# Patient Record
Sex: Male | Born: 1960 | Race: Black or African American | Hispanic: No | Marital: Married | Smoking: Current every day smoker
Health system: Southern US, Community
[De-identification: ages and names within clinical notes are randomized; demographics above are authoritative.]

## PROBLEM LIST (undated history)

## (undated) DIAGNOSIS — I10 Essential (primary) hypertension: Secondary | ICD-10-CM

---

## 2020-08-11 ENCOUNTER — Emergency Department: Payer: Federal, State, Local not specified - PPO

## 2020-08-11 ENCOUNTER — Emergency Department
Admission: EM | Admit: 2020-08-11 | Discharge: 2020-08-29 | Disposition: E | Payer: Federal, State, Local not specified - PPO | Attending: Emergency Medicine | Admitting: Emergency Medicine

## 2020-08-11 DIAGNOSIS — I10 Essential (primary) hypertension: Secondary | ICD-10-CM | POA: Insufficient documentation

## 2020-08-11 DIAGNOSIS — D696 Thrombocytopenia, unspecified: Secondary | ICD-10-CM | POA: Insufficient documentation

## 2020-08-11 DIAGNOSIS — Z20822 Contact with and (suspected) exposure to covid-19: Secondary | ICD-10-CM | POA: Insufficient documentation

## 2020-08-11 DIAGNOSIS — I639 Cerebral infarction, unspecified: Secondary | ICD-10-CM | POA: Diagnosis not present

## 2020-08-11 DIAGNOSIS — M311 Thrombotic microangiopathy, unspecified: Secondary | ICD-10-CM | POA: Insufficient documentation

## 2020-08-11 DIAGNOSIS — D693 Immune thrombocytopenic purpura: Secondary | ICD-10-CM | POA: Insufficient documentation

## 2020-08-11 DIAGNOSIS — R4182 Altered mental status, unspecified: Secondary | ICD-10-CM | POA: Insufficient documentation

## 2020-08-11 DIAGNOSIS — N179 Acute kidney failure, unspecified: Secondary | ICD-10-CM | POA: Insufficient documentation

## 2020-08-11 DIAGNOSIS — F17200 Nicotine dependence, unspecified, uncomplicated: Secondary | ICD-10-CM | POA: Diagnosis not present

## 2020-08-11 DIAGNOSIS — M3119 Other thrombotic microangiopathy: Secondary | ICD-10-CM

## 2020-08-11 HISTORY — DX: Essential (primary) hypertension: I10

## 2020-08-11 LAB — URINALYSIS, COMPLETE (UACMP) WITH MICROSCOPIC
RBC / HPF: >50 RBC/hpf — ABNORMAL HIGH (ref 0–5)
Specific Gravity, Urine: >1.05 — ABNORMAL HIGH (ref 1.005–1.030)
Squamous Epithelial / HPF: NONE SEEN (ref 0–5)

## 2020-08-11 LAB — DIFFERENTIAL
Abs Immature Granulocytes: 0.44 10*3/uL — ABNORMAL HIGH (ref 0.00–0.07)
Basophils Absolute: 0.1 10*3/uL (ref 0.0–0.1)
Basophils Relative: 0 %
Eosinophils Absolute: 0 10*3/uL (ref 0.0–0.5)
Eosinophils Relative: 0 %
Immature Granulocytes: 3 %
Lymphocytes Relative: 9 %
Lymphs Abs: 1.1 10*3/uL (ref 0.7–4.0)
Monocytes Absolute: 1.4 10*3/uL — ABNORMAL HIGH (ref 0.1–1.0)
Monocytes Relative: 10 %
Neutro Abs: 10.2 10*3/uL — ABNORMAL HIGH (ref 1.7–7.7)
Neutrophils Relative %: 78 %
Smear Review: DECREASED

## 2020-08-11 LAB — CBC
HCT: 38.2 % — ABNORMAL LOW (ref 39.0–52.0)
Hemoglobin: 13.3 g/dL (ref 13.0–17.0)
MCH: 32.1 pg (ref 26.0–34.0)
MCHC: 34.8 g/dL (ref 30.0–36.0)
MCV: 92.3 fL (ref 80.0–100.0)
Platelets: <5 10*3/uL — CL (ref 150–400)
RBC: 4.14 MIL/uL — ABNORMAL LOW (ref 4.22–5.81)
RDW: 18.8 % — ABNORMAL HIGH (ref 11.5–15.5)
WBC: 13.2 10*3/uL — ABNORMAL HIGH (ref 4.0–10.5)
nRBC: 0.5 % — ABNORMAL HIGH (ref 0.0–0.2)

## 2020-08-11 LAB — COMPREHENSIVE METABOLIC PANEL
ALT: UNDETERMINED U/L (ref 0–44)
ALT: UNDETERMINED U/L (ref 0–44)
AST: UNDETERMINED U/L (ref 15–41)
AST: UNDETERMINED U/L (ref 15–41)
Albumin: 3 g/dL — ABNORMAL LOW (ref 3.5–5.0)
Albumin: 2.8 g/dL — ABNORMAL LOW (ref 3.5–5.0)
Alkaline Phosphatase: UNDETERMINED U/L (ref 38–126)
Alkaline Phosphatase: UNDETERMINED U/L (ref 38–126)
BUN: 60 mg/dL — ABNORMAL HIGH (ref 6–20)
BUN: 73 mg/dL — ABNORMAL HIGH (ref 6–20)
CO2: 19 mmol/L — ABNORMAL LOW (ref 22–32)
CO2: 14 mmol/L — ABNORMAL LOW (ref 22–32)
Calcium: 9 mg/dL (ref 8.9–10.3)
Calcium: 8.5 mg/dL — ABNORMAL LOW (ref 8.9–10.3)
Chloride: 101 mmol/L (ref 98–111)
Chloride: 104 mmol/L (ref 98–111)
Creatinine, Ser: 3.88 mg/dL — ABNORMAL HIGH (ref 0.61–1.24)
Creatinine, Ser: 4.43 mg/dL — ABNORMAL HIGH (ref 0.61–1.24)
GFR, Estimated: 17 mL/min — ABNORMAL LOW (ref >60)
GFR, Estimated: 15 mL/min — ABNORMAL LOW (ref >60)
Glucose, Bld: 254 mg/dL — ABNORMAL HIGH (ref 70–99)
Glucose, Bld: 317 mg/dL — ABNORMAL HIGH (ref 70–99)
Potassium: UNDETERMINED mmol/L (ref 3.5–5.1)
Potassium: UNDETERMINED mmol/L (ref 3.5–5.1)
Sodium: UNDETERMINED mmol/L (ref 135–145)
Sodium: UNDETERMINED mmol/L (ref 135–145)
Total Bilirubin: UNDETERMINED mg/dL (ref 0.3–1.2)
Total Bilirubin: UNDETERMINED mg/dL (ref 0.3–1.2)
Total Protein: 8.7 g/dL — ABNORMAL HIGH (ref 6.5–8.1)
Total Protein: 7.7 g/dL (ref 6.5–8.1)

## 2020-08-11 LAB — CBG MONITORING, ED
Glucose-Capillary: 247 mg/dL — ABNORMAL HIGH (ref 70–99)
Glucose-Capillary: 287 mg/dL — ABNORMAL HIGH (ref 70–99)

## 2020-08-11 LAB — URINE DRUG SCREEN, QUALITATIVE (ARMC ONLY)
Amphetamines, Ur Screen: NOT DETECTED
Barbiturates, Ur Screen: NOT DETECTED
Benzodiazepine, Ur Scrn: NOT DETECTED
Cannabinoid 50 Ng, Ur ~~LOC~~: NOT DETECTED
Cocaine Metabolite,Ur ~~LOC~~: NOT DETECTED
MDMA (Ecstasy)Ur Screen: NOT DETECTED
Methadone Scn, Ur: NOT DETECTED
Opiate, Ur Screen: NOT DETECTED
Phencyclidine (PCP) Ur S: NOT DETECTED
Tricyclic, Ur Screen: NOT DETECTED

## 2020-08-11 LAB — LACTATE DEHYDROGENASE: LDH: 3194 U/L — ABNORMAL HIGH (ref 98–192)

## 2020-08-11 LAB — RETIC PANEL
Immature Retic Fract: 25.4 % — ABNORMAL HIGH (ref 2.3–15.9)
RBC.: 4.16 MIL/uL — ABNORMAL LOW (ref 4.22–5.81)
Retic Count, Absolute: 117.3 10*3/uL (ref 19.0–186.0)
Retic Ct Pct: 2.8 % (ref 0.4–3.1)
Reticulocyte Hemoglobin: 36.4 pg (ref >27.9)

## 2020-08-11 LAB — PROTIME-INR
INR: 1.4 — ABNORMAL HIGH (ref 0.8–1.2)
Prothrombin Time: 16.2 seconds — ABNORMAL HIGH (ref 11.4–15.2)

## 2020-08-11 LAB — APTT: aPTT: 33 seconds (ref 24–36)

## 2020-08-11 LAB — CK: Total CK: UNDETERMINED U/L (ref 49–397)

## 2020-08-11 LAB — ABO/RH: ABO/RH(D): A POS

## 2020-08-11 LAB — RESP PANEL BY RT-PCR (FLU A&B, COVID) ARPGX2
Influenza A by PCR: NEGATIVE
Influenza B by PCR: NEGATIVE
SARS Coronavirus 2 by RT PCR: NEGATIVE

## 2020-08-11 LAB — TRIGLYCERIDES: Triglycerides: 323 mg/dL — ABNORMAL HIGH (ref <150)

## 2020-08-11 LAB — PATHOLOGIST SMEAR REVIEW

## 2020-08-11 LAB — LACTIC ACID, PLASMA
Lactic Acid, Venous: 3.7 mmol/L (ref 0.5–1.9)
Lactic Acid, Venous: 4.5 mmol/L (ref 0.5–1.9)

## 2020-08-11 LAB — AMMONIA: Ammonia: UNDETERMINED umol/L (ref 9–35)

## 2020-08-11 LAB — ETHANOL: Alcohol, Ethyl (B): <10 mg/dL (ref <10)

## 2020-08-11 MED ORDER — CALCIUM CHLORIDE 10 % IV SOLN: 1.0000 g | Freq: Once | INTRAVENOUS | Status: DC

## 2020-08-11 MED ORDER — ATROPINE SULFATE 1 MG/ML IJ SOLN
INTRAMUSCULAR | Status: AC | PRN
Start: 2020-08-11 — End: 2020-08-11
  Administered 2020-08-11: 1 mg via INTRAVENOUS

## 2020-08-11 MED ORDER — LEVETIRACETAM IN NACL 1500 MG/100ML IV SOLN
1500.0000 mg | Freq: Once | INTRAVENOUS | Status: AC
  Administered 2020-08-11: 1500 mg via INTRAVENOUS
  Filled 2020-08-11: qty 100

## 2020-08-11 MED ORDER — LORAZEPAM 2 MG/ML IJ SOLN
2.0000 mg | Freq: Once | INTRAMUSCULAR | Status: AC
  Administered 2020-08-11: 2 mg via INTRAVENOUS

## 2020-08-11 MED ORDER — ONDANSETRON HCL 4 MG/2ML IJ SOLN: 4.0000 mg | Freq: Once | INTRAMUSCULAR | Status: AC

## 2020-08-11 MED ORDER — SODIUM BICARBONATE 8.4 % IV SOLN
INTRAVENOUS | Status: AC | PRN
  Administered 2020-08-11 (×2): 50 meq via INTRAVENOUS

## 2020-08-11 MED ORDER — ONDANSETRON HCL 4 MG/2ML IJ SOLN
INTRAMUSCULAR | Status: AC
  Administered 2020-08-11: 4 mg via INTRAVENOUS
  Filled 2020-08-11: qty 2

## 2020-08-11 MED ORDER — PROPOFOL 1000 MG/100ML IV EMUL: 0.0000 ug/kg/min | INTRAVENOUS | Status: DC

## 2020-08-11 MED ORDER — EPINEPHRINE 1 MG/10ML IJ SOSY
PREFILLED_SYRINGE | INTRAMUSCULAR | Status: AC | PRN
  Administered 2020-08-11: 1 mg via INTRAVENOUS

## 2020-08-11 MED ORDER — ATROPINE SULFATE 1 MG/ML IJ SOLN
INTRAMUSCULAR | Status: AC | PRN
  Administered 2020-08-11: 1 mg via INTRAVENOUS

## 2020-08-11 MED ORDER — CALCIUM CHLORIDE 10 % IV SOLN
INTRAVENOUS | Status: AC
  Filled 2020-08-11: qty 20

## 2020-08-11 MED ORDER — LORAZEPAM 2 MG/ML IJ SOLN
1.0000 mg | INTRAMUSCULAR | Status: DC | PRN
  Filled 2020-08-11: qty 1

## 2020-08-11 MED ORDER — FENTANYL CITRATE (PF) 100 MCG/2ML IJ SOLN: 100.0000 ug | INTRAMUSCULAR | Status: DC | PRN

## 2020-08-11 MED ORDER — METHYLPREDNISOLONE SODIUM SUCC 125 MG IJ SOLR
125.0000 mg | Freq: Once | INTRAMUSCULAR | Status: AC
  Administered 2020-08-11: 125 mg via INTRAVENOUS
  Filled 2020-08-11: qty 2

## 2020-08-11 MED ORDER — EPINEPHRINE HCL 5 MG/250ML IV SOLN IN NS
0.5000 ug/min | INTRAVENOUS | Status: DC
  Administered 2020-08-11: 17:00:00 10 ug/min via INTRAVENOUS
  Filled 2020-08-11: qty 250

## 2020-08-11 MED ORDER — SODIUM BICARBONATE 8.4 % IV SOLN
INTRAVENOUS | Status: AC
  Filled 2020-08-11: qty 50

## 2020-08-11 MED ORDER — SODIUM CHLORIDE 0.9 % IV BOLUS
1000.0000 mL | Freq: Once | INTRAVENOUS | Status: AC
  Administered 2020-08-11: 1000 mL via INTRAVENOUS

## 2020-08-11 MED ORDER — CALCIUM CHLORIDE 10 % IV SOLN
INTRAVENOUS | Status: AC | PRN
  Administered 2020-08-11: 1 g via INTRAVENOUS

## 2020-08-11 MED ORDER — ETOMIDATE 2 MG/ML IV SOLN
10.0000 mg | Freq: Once | INTRAVENOUS | Status: AC
  Administered 2020-08-11: 10 mg via INTRAVENOUS

## 2020-08-11 MED ORDER — LORAZEPAM 2 MG/ML IJ SOLN
INTRAMUSCULAR | Status: AC
  Administered 2020-08-11: 1 mg via INTRAVENOUS
  Filled 2020-08-11: qty 1

## 2020-08-11 MED ORDER — NOREPINEPHRINE 4 MG/250ML-% IV SOLN
INTRAVENOUS | Status: AC
  Administered 2020-08-11: 5 ug/min via INTRAVENOUS
  Filled 2020-08-11: qty 250

## 2020-08-11 MED ORDER — SODIUM CHLORIDE 0.9% FLUSH
3.0000 mL | Freq: Once | INTRAVENOUS | Status: AC
Start: 2020-08-11 — End: 2020-08-11
  Administered 2020-08-11: 3 mL via INTRAVENOUS

## 2020-08-11 MED ORDER — NOREPINEPHRINE 4 MG/250ML-% IV SOLN: 0.0000 ug/min | INTRAVENOUS | Status: DC

## 2020-08-11 MED ORDER — VALPROATE SODIUM 100 MG/ML IV SOLN
2000.0000 mg | Freq: Once | INTRAVENOUS | Status: DC
  Filled 2020-08-11: qty 20

## 2020-08-11 MED ORDER — SODIUM CHLORIDE 0.9 % IV SOLN: 10.0000 mL/h | Freq: Once | INTRAVENOUS | Status: DC

## 2020-08-11 MED ORDER — ROCURONIUM BROMIDE 50 MG/5ML IV SOLN
100.0000 mg | Freq: Once | INTRAVENOUS | Status: AC
  Administered 2020-08-11: 100 mg via INTRAVENOUS
  Filled 2020-08-11: qty 10

## 2020-08-11 MED ORDER — INSULIN ASPART 100 UNIT/ML ~~LOC~~ SOLN: 5.0000 [IU] | Freq: Once | SUBCUTANEOUS | Status: DC

## 2020-08-11 MED ORDER — STERILE WATER FOR INJECTION IV SOLN
INTRAVENOUS | Status: DC
  Filled 2020-08-11 (×3): qty 850

## 2020-08-11 MED ORDER — PROPOFOL 1000 MG/100ML IV EMUL
INTRAVENOUS | Status: AC
  Administered 2020-08-11: 15 ug/kg/min via INTRAVENOUS
  Filled 2020-08-11: qty 100

## 2020-08-11 MED ORDER — SODIUM CHLORIDE 0.9 % IV SOLN
INTRAVENOUS | Status: AC | PRN
  Administered 2020-08-11: 999 mL/h via INTRAVENOUS

## 2020-08-11 MED ORDER — CALCIUM GLUCONATE-NACL 1-0.675 GM/50ML-% IV SOLN: 1.0000 g | Freq: Once | INTRAVENOUS | Status: DC

## 2020-08-12 LAB — BPAM FFP
Blood Product Expiration Date: 202201192359
ISSUE DATE / TIME: 202201141733
Unit Type and Rh: 8400

## 2020-08-12 LAB — PREPARE FRESH FROZEN PLASMA

## 2020-08-12 MED FILL — Medication: Qty: 1 | Status: AC

## 2020-08-13 LAB — TYPE AND SCREEN
ABO/RH(D): A POS
Antibody Screen: NEGATIVE

## 2020-08-15 LAB — ADAMTS13 ANTIBODY: ADAMTS13 Antibody: 24 Units/mL — ABNORMAL HIGH (ref <12)

## 2020-08-15 LAB — HAPTOGLOBIN: Haptoglobin: <10 mg/dL — ABNORMAL LOW (ref 29–370)

## 2020-08-15 LAB — ADAMTS13 ACTIVITY: Adamts 13 Activity: <2 % — CL (ref >66.8)

## 2020-08-29 NOTE — ED Notes (Signed)
Received bed assignment from [facility]  MICU [id number]  [id number]

## 2020-08-29 NOTE — ED Notes (Signed)
Called [facility] for transfer spoke to Ginger  [phone]

## 2020-08-29 NOTE — Code Documentation (Signed)
FFP started to the right hand by primary RN Ian Malkin. ER provider at bedside.

## 2020-08-29 NOTE — Code Documentation (Signed)
Provider to come to bedside. RN has called pts sister at providers request to talk with her.

## 2020-08-29 NOTE — Consult Note (Addendum)
Referring Physician: Dr. Larinda Buttery    Chief Complaint: Acute onset of multifocal neurological deficits including dysphasia  HPI: Kevin Jacobson is an 60 y.o. male presenting acutely from home via sister's vehicle after she found him aphasic, dysarthric, confused, weak and with unsteady gait. LKN was 5 PM yesterday when she spoke with him over the telephone. He had been experiencing malaise, "feeling sick" progressively for the past 2 weeks with congestion. He has had progressively decreased appetite over the past 2 weeks as well. His sister had him evaluated at a health care facility yesterday and he was subsequently discharged. She thought he might have had an infection, but he has tested negative for flu and Covid.  This AM she called him and he sounded confused with garbled speech. He was also tearful and was stating that he had blood in his urine. He also had been vomiting and with diarrhea overnight. When she arrived at his house, he was aphasic, confused, dysarthric and weak. When she walked him to her car, he was very unsteady. His face appeared "twiisted" but she cannot further specify which side was weak. She states that on the way to the hospital he was only able to communicate with dysarthric yes/no answers. Sister is not aware of him being on any blood thinners. He has no prior history of stroke.   Per his sister, he has a history of gout, HTN and possible "borderline diabetes".   LSN: 1700 on Thursday tPA Given: No: Out of the time window.   Past Medical History:  Diagnosis Date  . Hypertension     History reviewed. No pertinent surgical history.  No family history on file. Social History:  reports that he has been smoking. He has never used smokeless tobacco. No history on file for alcohol use and drug use.  Allergies: No Known Allergies  Home Medications:  No medications listed in Epic Sister does not know what his medications are  ROS: Unable to obtain due to aphasia.    Physical Examination: Blood pressure (!) 168/103, pulse 88, resp. rate (!) 24, SpO2 98 %.  HEENT: [place]/AT Lungs: Respirations unlabored Ext: No edema GU: Dark brown, minimal fluid produced after foley placement  Neurologic Examination: Mental Status: Awake with decreased level of alertness. Follows some commands. Agitated. Speech is dysfluent and dysarthric, with 1-2 word answers only. Only oriented to his name and the city. Left sided neglect is noted.  Cranial Nerves: II:  Decreased blink to threat bilaterally. Patient says "yes" when asked if he can see moving fingers in his temporal visual fields bilaterally. PERRL.  III,IV, VI: No ptosis. Eyes are deviated to the right, but he is able to cross the midline to the left with some difficulty. No nystagmus.  V: Reacts to FT bilaterally, but not briskly VII: Decreased labial fissure on the left when asked to grimace and smile.  VIII: Hearing intact to some commands IX,X: Nasal speech XI: Head preferentially rotated to the right XII: Does not protrude tongue to command. Dysarthric.  Motor: Moves BUE intermittently during RN attempts to place IV. Will resist equally but movements have a flailing, ataxic quality. Will resist with each upper extremity with 4-5/5 strength, but each arm will drop relatively quickly to the bed after being passively raised.  LLE with decreased movement to noxious relative to the left. Drops quickly after being raised passively by examiner.  RLE with brisk withdrawal to noxious and writhing movements in response to pain as RNs attempt to place  IV.  Sensory: As above. There is a trend towards decreased responsiveness to left sided stimuli.  Deep Tendon Reflexes:  2+ bilateral brachioradialis, patellae and achilles.  Plantars:  Right: downgoing   Left: mute Cerebellar: Unable to assess due to agitation and cognitive/comprehension deficit Gait: Deferred  No results found for this or any previous visit (from the  past 48 hour(s)). CT HEAD CODE STROKE WO CONTRAST  Result Date: 08-20-2020 CLINICAL DATA:  Code stroke.  Acute neuro deficit.  Rule out stroke. EXAM: CT HEAD WITHOUT CONTRAST TECHNIQUE: Contiguous axial images were obtained from the base of the skull through the vertex without intravenous contrast. COMPARISON:  None. FINDINGS: Brain: No evidence of acute infarction, hemorrhage, hydrocephalus, extra-axial collection or mass lesion/mass effect. Vascular: Negative for hyperdense vessel Skull: Negative Sinuses/Orbits: Mucosal edema left maxillary sinus. Mild mucosal edema ethmoid sinuses bilaterally. Negative orbit. Other: None ASPECTS (Alberta Stroke Program Early CT Score) - Ganglionic level infarction (caudate, lentiform nuclei, internal capsule, insula, M1-M3 cortex): 7 - Supraganglionic infarction (M4-M6 cortex): 3 Total score (0-10 with 10 being normal): 10 IMPRESSION: 1. Negative CT head 2. ASPECTS is 10 3. These results were called by telephone at the time of interpretation on 20-Aug-2020 at 10:25 am to provider [facility] , who verbally acknowledged these results. Electronically Signed   By: Marlan Palau M.D.   On: August 20, 2020 10:25    Assessment: 60 y.o. male presenting with acute onset of dysphasia, dysarthria, confusion, patchy motor weakness, left facial droop and rightward gaze deviation in the context of a 2 week history of malaise, decreased appetite and development of dark urine overnight.  1. Neurological exam suggests multifocal cerebral lesions not yet visible on CT. Most likely acute ischemic infarctions.  2. CT head without acute abnormality.  3. Essentially no urine obtained with foley placement. A small amount of dark brown fluid is seen in the foley bag. Suggests anuria. DDx includes ARF due to rhabdomyolysis or other etiologies.  4. Covid and flu negative on recent tests per sister.  5. Stroke Risk Factors - HTN and possible pre-diabetes  Recommendations: 1. STAT MRI brain with  MRA. No contrast due to high suspicion for AKI.  2. PT consult, OT consult, Speech consult 3. Frequent neuro checks 4. STAT metabolic panel   Addendum: - MRI reveals subtle but clearly abnormal patchy restricted diffusion and FLAIR hyperintensity involving the cortical ribbon of both postcentral gyri, the adjacent precentral gyri, the occipital poles and anterior left frontal lobe. DDx unusual presentation of PRES, subclinical multifocal seizure activity, viral encephalitis and autoimmune encephalitis. Discussed with Neuroradiologist Dr. Margo Aye.  - No LVO on MRA - Will need STAT LP in the ED. Discussed with EDP. Labs should include cell count with differential, protein, glucose, IgG index, HSV PCR, gram stain, fungal stain, cryptococcal antigen, bacterial culture and fungal culture. Discussed with EDP.  - STAT EEG (ordered)  @Electronically  signed: Dr. 08/20/20, 10:52 AM

## 2020-08-29 NOTE — Code Documentation (Signed)
Primary RN called to bedside.

## 2020-08-29 NOTE — ED Notes (Signed)
Pt with 1 episode of vomiting that appears to have blood

## 2020-08-29 NOTE — Progress Notes (Signed)
CODE STROKE- PHARMACY COMMUNICATION   Time CODE STROKE called/page received: 1019  Time response to CODE STROKE was made: 1030   Time Stroke Kit retrieved from Pyxis: N/A, no tPA per physician  Name of Provider/Nurse contacted: Dr. Earle Gell 19-Aug-2020  10:41 AM

## 2020-08-29 NOTE — ED Notes (Signed)
Propofol stopped due to decreased BP of 57/14. ER provider at bedside. IVF of NS infusing

## 2020-08-29 NOTE — ED Notes (Signed)
Patient accepted by [facility] pending ICU bed assignment

## 2020-08-29 NOTE — ED Notes (Signed)
Pt transported to morgue at this time by transport staff.

## 2020-08-29 NOTE — ED Notes (Signed)
Pt transported to MRI 

## 2020-08-29 NOTE — Code Documentation (Signed)
ER provider stated no cardiac activity noted on ultrasound of the heart. Time of death called at this time by ER provider. All medications have been stopped at this time.

## 2020-08-29 NOTE — Code Documentation (Addendum)
ER provider at bedside. 2 Nurse techs, primary RN and this RN at bedside. ER provider has spoken with family. ER provider at bedside to do a bedside ultrasound of the heart at this time. ER provider has turned off pacing

## 2020-08-29 NOTE — ED Notes (Signed)
Powershared images to [facility]  1310

## 2020-08-29 NOTE — ED Notes (Signed)
Called Code Stroke to [facility] and [phone]

## 2020-08-29 NOTE — ED Triage Notes (Signed)
PT to ED via POV. PT has been sick for about 2 weeks. LKW last night around 5pm but has been  Deteriorating mentally. PT was able to call his sister this morning and seemed confused but able to talk and articulate. PT is minimally verbally responsive at this time but alert. PT slumping to left side, droop noted to L corner of mouth. PT is diaphoretic.

## 2020-08-29 NOTE — Code Documentation (Signed)
ER provider at bedside with ultrasound to look at heart.

## 2020-08-29 NOTE — ED Provider Notes (Signed)
[facility] Emergency Department Provider Note   ____________________________________________   Event Date/Time   First MD Initiated Contact with Patient 12/16/20 1024     (approximate)  I have reviewed the triage vital signs and the nursing notes.   HISTORY  Chief Complaint Altered Mental Status   HPI Kevin Jacobson is a 60 y.o. male with past medical history of hypertension and gout who presents to the ED for altered mental status.  History is limited due to patient's altered mental status and majority of history is obtained from his sister.  She states that patient has complained of greater than 1 week of subjective fevers, nausea, vomiting, diarrhea, malaise, and body aches.  He recently started complaining that his urine was dark black.  Sister spoke to the patient around 5 PM last night, at which time he seemed to be acting like his usual self.  When she spoke with him earlier this morning, he sounded very disoriented over the phone, repeating questions and making incoherent statements.  She also felt like he was slurring his speech.  She drove over to check on him, where she found him to be having difficulty walking with weakness on his left side.  She then immediately drove him to the ER.        Past Medical History:  Diagnosis Date  . Hypertension     Patient Active Problem List   Diagnosis Date Noted  . Thrombotic microangiopathy   . Thrombocytopenia (HCC)   . Altered mental status   . AKI (acute kidney injury) (HCC)     History reviewed. No pertinent surgical history.  Prior to Admission medications   Not on File    Allergies Patient has no known allergies.  No family history on file.  Social History Social History   Tobacco Use  . Smoking status: Current Every Day Smoker  . Smokeless tobacco: Never Used    Review of Systems Unable to obtain secondary to altered mental  status  ____________________________________________   PHYSICAL EXAM:  VITAL SIGNS: ED Triage Vitals  Enc Vitals Group     BP 12/16/20 1005 (!) 168/103     Pulse Rate 12/16/20 1005 88     Resp 12/16/20 1005 (!) 24     Temp --      Temp src --      SpO2 12/16/20 1005 98 %     Weight --      Height --      Head Circumference --      Peak Flow --      Pain Score 12/16/20 1003 0     Pain Loc --      Pain Edu? --      Excl. in GC? --     Constitutional: Awake and alert, disoriented. Eyes: Conjunctivae are normal.  Pupils equal round and reactive to light bilaterally.  Sclera icteric. Head: Atraumatic. Nose: No congestion/rhinnorhea. Mouth/Throat: Mucous membranes are moist. Neck: Normal ROM Cardiovascular: Tachycardic, regular rhythm. Grossly normal heart sounds. Respiratory: Normal respiratory effort.  No retractions. Lungs CTAB. Gastrointestinal: Soft and nontender. No distention. Genitourinary: deferred Musculoskeletal: No lower extremity tenderness nor edema. Neurologic: Opens eyes to voice, groaning incoherent speech.  Spontaneous movement noted in all 4 extremities but diminished movement in left upper and lower extremities with diminished response to pain. Skin:  Skin is warm, dry and intact. No rash noted. Psychiatric: Unable to assess.  ____________________________________________   LABS (all labs ordered are listed, but  only abnormal results are displayed)  Labs Reviewed  PROTIME-INR - Abnormal; Notable for the following components:      Result Value   Prothrombin Time 16.2 (*)    INR 1.4 (*)    All other components within normal limits  CBC - Abnormal; Notable for the following components:   WBC 13.2 (*)    RBC 4.14 (*)    HCT 38.2 (*)    RDW 18.8 (*)    Platelets <5 (*)    nRBC 0.5 (*)    All other components within normal limits  DIFFERENTIAL - Abnormal; Notable for the following components:   Neutro Abs 10.2 (*)    Monocytes Absolute 1.4 (*)     Abs Immature Granulocytes 0.44 (*)    All other components within normal limits  COMPREHENSIVE METABOLIC PANEL - Abnormal; Notable for the following components:   CO2 19 (*)    Glucose, Bld 254 (*)    BUN 60 (*)    Creatinine, Ser 3.88 (*)    Total Protein 8.7 (*)    Albumin 3.0 (*)    GFR, Estimated 17 (*)    All other components within normal limits  LACTIC ACID, PLASMA - Abnormal; Notable for the following components:   Lactic Acid, Venous 3.7 (*)    All other components within normal limits  LACTIC ACID, PLASMA - Abnormal; Notable for the following components:   Lactic Acid, Venous 4.5 (*)    All other components within normal limits  URINALYSIS, COMPLETE (UACMP) WITH MICROSCOPIC - Abnormal; Notable for the following components:   Color, Urine BROWN (*)    APPearance CLOUDY (*)    Specific Gravity, Urine >1.050 (*)    Glucose, UA   (*)    Value: TEST NOT REPORTED DUE TO COLOR INTERFERENCE OF URINE PIGMENT   Hgb urine dipstick   (*)    Value: TEST NOT REPORTED DUE TO COLOR INTERFERENCE OF URINE PIGMENT   Bilirubin Urine   (*)    Value: TEST NOT REPORTED DUE TO COLOR INTERFERENCE OF URINE PIGMENT   Ketones, ur   (*)    Value: TEST NOT REPORTED DUE TO COLOR INTERFERENCE OF URINE PIGMENT   Protein, ur   (*)    Value: TEST NOT REPORTED DUE TO COLOR INTERFERENCE OF URINE PIGMENT   Nitrite   (*)    Value: TEST NOT REPORTED DUE TO COLOR INTERFERENCE OF URINE PIGMENT   Leukocytes,Ua   (*)    Value: TEST NOT REPORTED DUE TO COLOR INTERFERENCE OF URINE PIGMENT   RBC / HPF >50 (*)    Bacteria, UA RARE (*)    All other components within normal limits  LACTATE DEHYDROGENASE - Abnormal; Notable for the following components:   LDH 3,194 (*)    All other components within normal limits  RETIC PANEL - Abnormal; Notable for the following components:   RBC. 4.16 (*)    Immature Retic Fract 25.4 (*)    All other components within normal limits  CBG MONITORING, ED - Abnormal; Notable for  the following components:   Glucose-Capillary 247 (*)    All other components within normal limits  RESP PANEL BY RT-PCR (FLU A&B, COVID) ARPGX2  APTT  ETHANOL  CK  URINE DRUG SCREEN, QUALITATIVE ([facility] ONLY)  PATHOLOGIST SMEAR REVIEW  HAPTOGLOBIN  ADAMTS13 ACTIVITY  TRIGLYCERIDES  AMMONIA  COMPREHENSIVE METABOLIC PANEL  TYPE AND SCREEN  PREPARE FRESH FROZEN PLASMA  TYPE AND SCREEN  TYPE AND SCREEN  ____________________________________________  EKG  ED ECG REPORT I, Chesley Noon, the attending physician, personally viewed and interpreted this ECG.   Date: 2020-09-03  EKG Time: 10:27  Rate: 82  Rhythm: normal sinus rhythm  Axis: Normal  Intervals:none  ST&T Change: Nonspecific ST/T changes   PROCEDURES  Procedure(s) performed (including Critical Care):  .Critical Care Performed by: Chesley Noon, MD Authorized by: Chesley Noon, MD   Critical care provider statement:    Critical care time (minutes):  75   Critical care time was exclusive of:  Separately billable procedures and treating other patients and teaching time   Critical care was necessary to treat or prevent imminent or life-threatening deterioration of the following conditions:  CNS failure or compromise and circulatory failure   Critical care was time spent personally by me on the following activities:  Discussions with consultants, evaluation of patient's response to treatment, examination of patient, ordering and performing treatments and interventions, ordering and review of laboratory studies, ordering and review of radiographic studies, pulse oximetry, re-evaluation of patient's condition, obtaining history from patient or surrogate and review of old charts   I assumed direction of critical care for this patient from another provider in my specialty: no     Care discussed with: accepting provider at another facility       ____________________________________________   INITIAL IMPRESSION /  ASSESSMENT AND PLAN / ED COURSE       60 year old male with past history of hypertension and gout who presents to the ED for altered mental status noted by his sister this morning.  His last known well was 5 PM last night and patient is now clearly disoriented, opens eyes to voice but has groaning incoherent speech with diminished movement and pain response on the left.  Code stroke was called, CT head negative for acute process.  Patient evaluated by neurology who recommends rule out of large vessel occlusion with MRA given patient's dark urine and concern for acute renal failure.  MRA was performed and negative for large vessel occlusion but MRI brain does show diffuse small infarcts explaining patient's neurologic presentation.  He was also noted to have a platelet count of less than 5 with schistocytes noted on initial smear.  Labs also show acute renal failure with creatinine of 3.8 but unable to assess electrolytes accurately.  Case discussed with Dr. Cathie Hoops of hematology, who agrees that presentation is concerning for TTP.  Pathologist was able to review smear and confirms presence of numerous schistocytes.  Patient was treated with IV steroids and Dr. Cathie Hoops recommends transfusion of FFP as temporizing measure until we are able to transfer to facility with plasmapheresis capabilities.  No evidence of bleeding at this time, hemoglobin within normal limits.  Case discussed with hematologist and hospitalist at [facility], unfortunately they do not currently have the capacity to care for patient.  I spoke with Dr. Kearney Hard in the ICU at [facility], who was able to make a bed available for the patient for transfer.  Patient pending transfer at this time and mental status does appear to be gradually worsening, he continues to open his eyes to voice but his last interactive with less spontaneous movements.  Patient turned over to Dr. Erma Heritage pending transfer to [facility].      ____________________________________________   FINAL  CLINICAL IMPRESSION(S) / ED DIAGNOSES  Final diagnoses:  TTP (thrombotic thrombocytopenic purpura)  Cerebrovascular accident (CVA), unspecified mechanism [facility])     ED Discharge Orders    None  Note:  This document was prepared using Dragon voice recognition software and may include unintentional dictation errors.   Chesley Noon, MD 05-Sep-2020 [phone]

## 2020-08-29 NOTE — ED Notes (Signed)
Called [facility] spoke to [place] to initiate transfer images powershared  [phone]

## 2020-08-29 NOTE — Code Documentation (Signed)
Dr. Erma Heritage notified that we are unable to get blood pressure reading at this time.

## 2020-08-29 NOTE — Code Documentation (Signed)
ER provider is talking to pts sister under demographics.

## 2020-08-29 NOTE — Code Documentation (Signed)
ER provider is using zoll and has started to PACE pt

## 2020-08-29 NOTE — ED Provider Notes (Signed)
Assumed care from Dr. Charna Archer at 3 PM. Briefly, the patient is a 60 y.o. male with PMHx of  has a past medical history of Hypertension. here with TTP. Awaiting transport. Critically ill. Pt had reportedly been confused but protecting airway. Transport en route.   Labs Reviewed  PROTIME-INR - Abnormal; Notable for the following components:      Result Value   Prothrombin Time 16.2 (*)    INR 1.4 (*)    All other components within normal limits  CBC - Abnormal; Notable for the following components:   WBC 13.2 (*)    RBC 4.14 (*)    HCT 38.2 (*)    RDW 18.8 (*)    Platelets <5 (*)    nRBC 0.5 (*)    All other components within normal limits  DIFFERENTIAL - Abnormal; Notable for the following components:   Neutro Abs 10.2 (*)    Monocytes Absolute 1.4 (*)    Abs Immature Granulocytes 0.44 (*)    All other components within normal limits  COMPREHENSIVE METABOLIC PANEL - Abnormal; Notable for the following components:   CO2 19 (*)    Glucose, Bld 254 (*)    BUN 60 (*)    Creatinine, Ser 3.88 (*)    Total Protein 8.7 (*)    Albumin 3.0 (*)    GFR, Estimated 17 (*)    All other components within normal limits  LACTIC ACID, PLASMA - Abnormal; Notable for the following components:   Lactic Acid, Venous 3.7 (*)    All other components within normal limits  LACTIC ACID, PLASMA - Abnormal; Notable for the following components:   Lactic Acid, Venous 4.5 (*)    All other components within normal limits  URINALYSIS, COMPLETE (UACMP) WITH MICROSCOPIC - Abnormal; Notable for the following components:   Color, Urine BROWN (*)    APPearance CLOUDY (*)    Specific Gravity, Urine >1.050 (*)    Glucose, UA   (*)    Value: TEST NOT REPORTED DUE TO COLOR INTERFERENCE OF URINE PIGMENT   Hgb urine dipstick   (*)    Value: TEST NOT REPORTED DUE TO COLOR INTERFERENCE OF URINE PIGMENT   Bilirubin Urine   (*)    Value: TEST NOT REPORTED DUE TO COLOR INTERFERENCE OF URINE PIGMENT   Ketones, ur   (*)     Value: TEST NOT REPORTED DUE TO COLOR INTERFERENCE OF URINE PIGMENT   Protein, ur   (*)    Value: TEST NOT REPORTED DUE TO COLOR INTERFERENCE OF URINE PIGMENT   Nitrite   (*)    Value: TEST NOT REPORTED DUE TO COLOR INTERFERENCE OF URINE PIGMENT   Leukocytes,Ua   (*)    Value: TEST NOT REPORTED DUE TO COLOR INTERFERENCE OF URINE PIGMENT   RBC / HPF >50 (*)    Bacteria, UA RARE (*)    All other components within normal limits  LACTATE DEHYDROGENASE - Abnormal; Notable for the following components:   LDH 3,194 (*)    All other components within normal limits  RETIC PANEL - Abnormal; Notable for the following components:   RBC. 4.16 (*)    Immature Retic Fract 25.4 (*)    All other components within normal limits  CBG MONITORING, ED - Abnormal; Notable for the following components:   Glucose-Capillary 247 (*)    All other components within normal limits  RESP PANEL BY RT-PCR (FLU A&B, COVID) ARPGX2  APTT  ETHANOL  CK  URINE DRUG  SCREEN, QUALITATIVE ([facility] ONLY)  PATHOLOGIST SMEAR REVIEW  HAPTOGLOBIN  ADAMTS13 ACTIVITY  TRIGLYCERIDES  AMMONIA  COMPREHENSIVE METABOLIC PANEL  TYPE AND SCREEN  PREPARE FRESH FROZEN PLASMA  TYPE AND SCREEN  TYPE AND SCREEN    Course of Care: -At time of transfer of care, pt had witnessed GTC seizure activity <5 min. On my assessment, pt obtunded, tachycardic, in distress. Decision made to intubate for airway protection. Tolerated well. CXR confirms appropriate placement. -Shortly after, pt became increasingly hypotensive then bradycardic. Due to his likely ARF 2/2 TTP, bicarb pushes given as well as calcium due to possible hyperkalemia and profound acidosis (CO2 14, K not able to be calculated). Pt was placed on levophed gtt and 3L fluid boluses started.  -Despite this, pt remained persistently hypotensive and bradycardic. He was given multiple doses of bicarbonate, epinephrine, epi and levophed gtt, calcium, and fluids. FFP given as well. Pt paced  externally for profound hypokalemia and what appears to be new CHB on EKG. -Pt ultimately entered asystole arrest and expired. I was able to discuss his prognosis and illness with sister prior to termination of resuscitation. Suspect shock 2/2 ARF, TTP, likely end-organ ischemia leading to PEA then asystole.   Procedure Name: Intubation Date/Time: 27-Aug-2020 4:12 PM Performed by: Duffy Bruce, MD Pre-anesthesia Checklist: Patient identified, Patient being monitored, Emergency Drugs available, Timeout performed and Suction available Oxygen Delivery Method: Non-rebreather mask Preoxygenation: Pre-oxygenation with 100% oxygen Induction Type: Rapid sequence Ventilation: Mask ventilation without difficulty Laryngoscope Size: Mac and 4 Grade View: Grade I Tube size: 7.5 mm Number of attempts: 1 Airway Equipment and Method: Video-laryngoscopy and Rigid stylet Placement Confirmation: ETT inserted through vocal cords under direct vision,  CO2 detector and Breath sounds checked- equal and bilateral Secured at: 23 cm Tube secured with: ETT holder Dental Injury: Teeth and Oropharynx as per pre-operative assessment  Difficulty Due To: Difficulty was unanticipated Future Recommendations: Recommend- induction with short-acting agent, and alternative techniques readily available     .Critical Care Performed by: Duffy Bruce, MD Authorized by: Duffy Bruce, MD   Critical care provider statement:    Critical care time (minutes):  75   Critical care time was exclusive of:  Separately billable procedures and treating other patients and teaching time   Critical care was necessary to treat or prevent imminent or life-threatening deterioration of the following conditions:  Cardiac failure, circulatory failure, respiratory failure and metabolic crisis   Critical care was time spent personally by me on the following activities:  Development of treatment plan with patient or surrogate, discussions  with consultants, evaluation of patient's response to treatment, examination of patient, obtaining history from patient or surrogate, ordering and performing treatments and interventions, ordering and review of laboratory studies, ordering and review of radiographic studies, pulse oximetry, re-evaluation of patient's condition and review of old charts   I assumed direction of critical care for this patient from another provider in my specialty: no      Cardiopulmonary Resuscitation (CPR) Procedure Note Directed/Performed by: Evonnie Pat I personally directed ancillary staff and/or performed CPR in an effort to regain return of spontaneous circulation and to maintain cardiac, neuro and systemic perfusion.     Duffy Bruce, MD 08/12/20 [phone]

## 2020-08-29 NOTE — Progress Notes (Signed)
   08-28-20 1031  Clinical Encounter Type  Visited With Patient  Visit Type Initial;Spiritual support;Social support  Referral From Nurse  Consult/Referral To Chaplain  Tommas Olp responded to a code stroke in the ER-room 1. When I arrived the medical team was working on the Pt.  I made contact with a nurse who stated, "they are still  working on the PT and they will notify the Chaplain if we are needed.  No family members were present.

## 2020-08-29 NOTE — Consult Note (Addendum)
Hematology/Oncology Consult note [facility] [phone] [phone]  Patient Care Team: Administration, [facility] as PCP - General   Name of the patient: Michaelle CopasBillyjoe Wolanski  [id number]  11/17/1960   Date of visit: 2021-04-23 REASON FOR COSULTATION:  Thrombocytopenia History of presenting illness-  60 y.o. male with PMH listed at below who presents to ER for altered mental status.  Patient is not able to provide any history.  Main history was obtained from talking to the emergency room physician as well as medical record reviewing.  It appears that patient reports that he has been sick for the past 2 weeks.   Stroke code was called. 2021/02/05, MRI brain without contrast, MRI angiogram head . Quasi-symmetric abnormal cortical DWI, T2, and FLAIR signal in the superior perirolandic cortex and occipital lobes (definitely the right). No gyral thickening. No hemorrhage or mass effect. Sparing of deep gray matter nuclei, white matter, and the brainstem. There is a superimposed small acute infarct in the medial left cerebellum. 2. Top differential considerations include small acute cortical infarcts, infectious encephalitis, and perhaps mild posterior reversible encephalopathy syndrome (PRES). Seizure disorder is not suspected clinically, and the pattern is NOT suggestive of uremic encephalopathy.  3. Essentially negative intracranial MRA.  CBC showed WBC 13.2, hemoglobin 13.3, platelet <5000, no prior lab records to compare with in current EMR. Differential showed neutrophilia, monocytosis, CMP showed glucose 254, creatinine 3.88, total protein 8.7, albumin 3, sodium, potassium, bilirubin, AST ALT were not able to resolve due to interfering substance.  Alcohol <10   Hematology oncology was consulted for thrombocytopenia. -Pathology smear showed numerous schistocytes. Patient was seen and examined at the bedside.  No family members at bedside.  Review of  Systems  Unable to perform ROS: Mental status change    No Known Allergies  There are no problems to display for this patient.    Past Medical History:  Diagnosis Date  . Hypertension      History reviewed. No pertinent surgical history.  Social History   Socioeconomic History  . Marital status: Married    Spouse name: Not on file  . Number of children: Not on file  . Years of education: Not on file  . Highest education level: Not on file  Occupational History  . Not on file  Tobacco Use  . Smoking status: Current Every Day Smoker  . Smokeless tobacco: Never Used  Substance and Sexual Activity  . Alcohol use: Not on file  . Drug use: Not on file  . Sexual activity: Not on file  Other Topics Concern  . Not on file  Social History Narrative  . Not on file   Social Determinants of Health   Financial Resource Strain: Not on file  Food Insecurity: Not on file  Transportation Needs: Not on file  Physical Activity: Not on file  Stress: Not on file  Social Connections: Not on file  Intimate Partner Violence: Not on file     No family history on file.   Current Facility-Administered Medications:  .  LORazepam (ATIVAN) injection 1 mg, 1 mg, Intravenous, Q4H PRN, Chesley NoonJessup, Charles, MD .  methylPREDNISolone sodium succinate (SOLU-MEDROL) 125 mg/2 mL injection 125 mg, 125 mg, Intravenous, Once, Chesley NoonJessup, Charles, MD No current outpatient medications on file.   Physical exam:  Vitals:   2021-04-23 1005 2021-04-23 1145  BP: (!) 168/103 (!) 160/100  Pulse: 88 85  Resp: (!) 24 (!) 23  SpO2: 98% 96%   Physical Exam Constitutional:  Appearance: He is ill-appearing.  Cardiovascular:     Rate and Rhythm: Tachycardia present.  Pulmonary:     Comments: Tachypneic Abdominal:     Palpations: Abdomen is soft.  Musculoskeletal:        General: No swelling.  Skin:    General: Skin is warm.  Neurological:     Comments: Lethargic, he does not follow verbal commands          CMP Latest Ref Rng & Units 08-24-20  Glucose 70 - 99 mg/dL 161(W)  BUN 6 - 20 mg/dL 96(E)  Creatinine 4.54 - 1.24 mg/dL 0.98(J)  Sodium 191 - 478 mmol/L UNABLE TO RESULT DUE TO INTERFERING SUBSTANCES. JAG  Potassium 3.5 - 5.1 mmol/L UNABLE TO RESULT DUE TO INTERFERING SUBSTANCES. JAG  Chloride 98 - 111 mmol/L 101  CO2 22 - 32 mmol/L 19(L)  Calcium 8.9 - 10.3 mg/dL 9.0  Total Protein 6.5 - 8.1 g/dL 2.9(F)  Total Bilirubin 0.3 - 1.2 mg/dL UNABLE TO RESULT DUE TO INTERFERING SUBSTANCES. JAG  Alkaline Phos 38 - 126 U/L UNABLE TO RESULT DUE TO INTERFERING SUBSTANCES. JAG  AST 15 - 41 U/L UNABLE TO RESULT DUE TO INTERFERING SUBSTANCES. JAG  ALT 0 - 44 U/L UNABLE TO RESULT DUE TO INTERFERING SUBSTANCES. JAG   CBC Latest Ref Rng & Units 08/24/2020  WBC 4.0 - 10.5 K/uL 13.2(H)  Hemoglobin 13.0 - 17.0 g/dL 62.1  Hematocrit 30.8 - 52.0 % 38.2(L)  Platelets 150 - 400 K/uL <5(LL)    RADIOGRAPHIC STUDIES: I have personally reviewed the radiological images as listed and agreed with the findings in the report. MR ANGIO HEAD WO CONTRAST  Result Date: August 24, 2020 CLINICAL DATA:  60 year old male with renal failure, dark urine. "Sick for 2 weeks". Encephalopathic. EXAM: MRI HEAD WITHOUT CONTRAST MRA HEAD WITHOUT CONTRAST TECHNIQUE: Multiplanar, multiecho pulse sequences of the brain and surrounding structures were obtained without intravenous contrast. Angiographic images of the head were obtained using MRA technique without contrast. COMPARISON:  Head CT 1015 hours today. FINDINGS: Study is intermittently degraded by motion artifact despite repeated imaging attempts. MRI HEAD FINDINGS Brain: Linear 7 mm infarct in the medial left cerebellum (series 4, image 31). Superimposed subtle abnormal diffusion in the cortex of the right occipital pole (correlated with abnormal T2 and FLAIR hyperintensity as seen on series 20, image 4), and fairly symmetrically involving the superior perirolandic cortex  bilaterally (series 2, images 38 and 39 and note abnormal corresponding FLAIR series 18, image 22). The affected cortex is not expanded. There is no associated hemorrhage or mass effect. White matter seems to be spared in each of the affected areas. Questionable additional abnormal DWI in the right operculum (series 2, image 27), and along the anterior margin of the inferior right basal ganglia (image 22). Otherwise the deep gray nuclei and brainstem appear normal. No acute or chronic blood products identified. No debris within the ventricles. No midline shift, evidence of mass lesion, ventriculomegaly, extra-axial collection. Cervicomedullary junction and pituitary are within normal limits. No chronic cortical encephalomalacia identified. Vascular: Major intracranial vascular flow voids are preserved. Skull and upper cervical spine: Negative. Sinuses/Orbits: Negative orbits. Scattered paranasal sinus fluid and mild mucosal thickening. Other: Mastoids are clear. Grossly normal visible internal auditory structures. Small volume retained secretions in the nasopharynx. MRA HEAD FINDINGS Mild motion artifact. Preserved antegrade flow in the posterior circulation with fairly codominant distal vertebral arteries. Both PICA origins are patent. Patent vertebrobasilar junction and basilar artery without stenosis. Patent SCA and PCA origins.  Posterior communicating arteries are diminutive or absent. Bilateral PCA branches are within normal limits. Antegrade flow in both ICA siphons. Tortuous ICAs just below the skull base. No siphon stenosis. Patent carotid termini. Patent MCA and ACA origins. Diminutive anterior communicating artery. Visible bilateral ACA branches are within normal limits. Bilateral MCA M1 segments and MCA bifurcations are patent without stenosis. Visible bilateral MCA branches are within normal limits. IMPRESSION: 1. Quasi-symmetric abnormal cortical DWI, T2, and FLAIR signal in the superior perirolandic  cortex and occipital lobes (definitely the right). No gyral thickening. No hemorrhage or mass effect. Sparing of deep gray matter nuclei, white matter, and the brainstem. There is a superimposed small acute infarct in the medial left cerebellum. 2. Top differential considerations include small acute cortical infarcts, infectious encephalitis, and perhaps mild posterior reversible encephalopathy syndrome (PRES). Seizure disorder is not suspected clinically, and the pattern is NOT suggestive of uremic encephalopathy. 3. Essentially negative intracranial MRA. Preliminary report of the above was discussed by telephone with Dr. Caryl Pina on 04-Sep-2020 at 1133 hours. Electronically Signed   By: Odessa Fleming M.D.   On: 09/04/2020 11:58   MR BRAIN WO CONTRAST  Result Date: 09/04/20 CLINICAL DATA:  60 year old male with renal failure, dark urine. "Sick for 2 weeks". Encephalopathic. EXAM: MRI HEAD WITHOUT CONTRAST MRA HEAD WITHOUT CONTRAST TECHNIQUE: Multiplanar, multiecho pulse sequences of the brain and surrounding structures were obtained without intravenous contrast. Angiographic images of the head were obtained using MRA technique without contrast. COMPARISON:  Head CT 1015 hours today. FINDINGS: Study is intermittently degraded by motion artifact despite repeated imaging attempts. MRI HEAD FINDINGS Brain: Linear 7 mm infarct in the medial left cerebellum (series 4, image 31). Superimposed subtle abnormal diffusion in the cortex of the right occipital pole (correlated with abnormal T2 and FLAIR hyperintensity as seen on series 20, image 4), and fairly symmetrically involving the superior perirolandic cortex bilaterally (series 2, images 38 and 39 and note abnormal corresponding FLAIR series 18, image 22). The affected cortex is not expanded. There is no associated hemorrhage or mass effect. White matter seems to be spared in each of the affected areas. Questionable additional abnormal DWI in the right operculum  (series 2, image 27), and along the anterior margin of the inferior right basal ganglia (image 22). Otherwise the deep gray nuclei and brainstem appear normal. No acute or chronic blood products identified. No debris within the ventricles. No midline shift, evidence of mass lesion, ventriculomegaly, extra-axial collection. Cervicomedullary junction and pituitary are within normal limits. No chronic cortical encephalomalacia identified. Vascular: Major intracranial vascular flow voids are preserved. Skull and upper cervical spine: Negative. Sinuses/Orbits: Negative orbits. Scattered paranasal sinus fluid and mild mucosal thickening. Other: Mastoids are clear. Grossly normal visible internal auditory structures. Small volume retained secretions in the nasopharynx. MRA HEAD FINDINGS Mild motion artifact. Preserved antegrade flow in the posterior circulation with fairly codominant distal vertebral arteries. Both PICA origins are patent. Patent vertebrobasilar junction and basilar artery without stenosis. Patent SCA and PCA origins. Posterior communicating arteries are diminutive or absent. Bilateral PCA branches are within normal limits. Antegrade flow in both ICA siphons. Tortuous ICAs just below the skull base. No siphon stenosis. Patent carotid termini. Patent MCA and ACA origins. Diminutive anterior communicating artery. Visible bilateral ACA branches are within normal limits. Bilateral MCA M1 segments and MCA bifurcations are patent without stenosis. Visible bilateral MCA branches are within normal limits. IMPRESSION: 1. Quasi-symmetric abnormal cortical DWI, T2, and FLAIR signal in the superior perirolandic cortex  and occipital lobes (definitely the right). No gyral thickening. No hemorrhage or mass effect. Sparing of deep gray matter nuclei, white matter, and the brainstem. There is a superimposed small acute infarct in the medial left cerebellum. 2. Top differential considerations include small acute cortical  infarcts, infectious encephalitis, and perhaps mild posterior reversible encephalopathy syndrome (PRES). Seizure disorder is not suspected clinically, and the pattern is NOT suggestive of uremic encephalopathy. 3. Essentially negative intracranial MRA. Preliminary report of the above was discussed by telephone with Dr. Caryl Pina on 09/05/20 at 1133 hours. Electronically Signed   By: Odessa Fleming M.D.   On: 09/05/20 11:58   DG Chest Portable 1 View  Result Date: 09/05/20 CLINICAL DATA:  Altered mental status. EXAM: PORTABLE CHEST 1 VIEW COMPARISON:  None. FINDINGS: Prior sternotomy is noted. The cardiac silhouette is mildly enlarged. Lung volumes are low with mild elevation of the right hemidiaphragm. No confluent airspace opacity, edema, sizable pleural effusion, pneumothorax is identified. No acute osseous abnormality is seen. IMPRESSION: Low lung volumes without evidence of acute airspace disease. Electronically Signed   By: Sebastian Ache M.D.   On: 05-Sep-2020 11:56   CT HEAD CODE STROKE WO CONTRAST  Result Date: 09-05-20 CLINICAL DATA:  Code stroke.  Acute neuro deficit.  Rule out stroke. EXAM: CT HEAD WITHOUT CONTRAST TECHNIQUE: Contiguous axial images were obtained from the base of the skull through the vertex without intravenous contrast. COMPARISON:  None. FINDINGS: Brain: No evidence of acute infarction, hemorrhage, hydrocephalus, extra-axial collection or mass lesion/mass effect. Vascular: Negative for hyperdense vessel Skull: Negative Sinuses/Orbits: Mucosal edema left maxillary sinus. Mild mucosal edema ethmoid sinuses bilaterally. Negative orbit. Other: None ASPECTS (Alberta Stroke Program Early CT Score) - Ganglionic level infarction (caudate, lentiform nuclei, internal capsule, insula, M1-M3 cortex): 7 - Supraganglionic infarction (M4-M6 cortex): 3 Total score (0-10 with 10 being normal): 10 IMPRESSION: 1. Negative CT head 2. ASPECTS is 10 3. These results were called by telephone at the  time of interpretation on Sep 05, 2020 at 10:25 am to provider [facility] , who verbally acknowledged these results. Electronically Signed   By: Marlan Palau M.D.   On: 09-05-2020 10:25    Assessment and plan-   #Thrombocytopenia, platelet count is < 5000 Peripheral blood smear was reviewed by me and discussed with pathology Dr. Yetta Barre. 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 AKI, altered mental status  Plasmic score 5, intermediate risk for TTP.  Send ADAMTS 13 activity Patient will need to be started on plasma exchange which is not available at [facility].  I discussed with Dr. Larinda Buttery that patient need to be transferred immediately to facility that has to plasma exchange capacity. Recommend transfuse 1 unit of FFP as a temporizing measure.   Thank you for allowing me to participate in the care of this patient.   Rickard Patience, MD, PhD Hematology Oncology [facility] at [facility] Pager- [phone] 09-05-2020

## 2020-08-29 DEATH — deceased

## 2022-08-20 IMAGING — CT CT HEAD CODE STROKE
3 of 4 series · 15 of 47 positions shown, 18 images · non-contrast
Comparison: None.

CLINICAL DATA: Code stroke.  Acute neuro deficit.  Rule out stroke.

EXAM:
CT HEAD WITHOUT CONTRAST
TECHNIQUE: Contiguous axial images were obtained from the base of the skull
through the vertex without intravenous contrast.

[Series 4: head wo · axial · 0.40mm/px · z∈[-161,-36]mm · 9 of 31 slices shown, 12 images]
[im 3/31  brain]
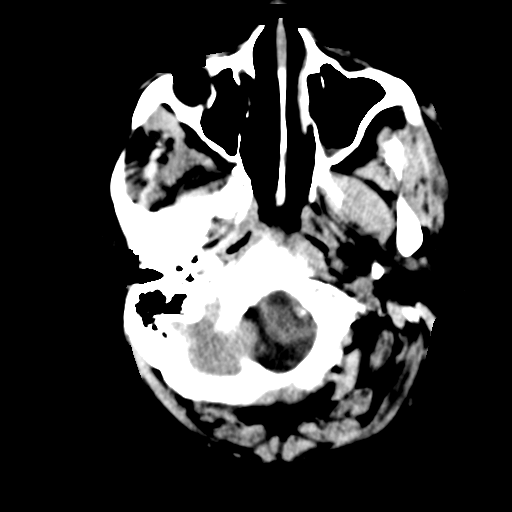
[im 3/31  bone]
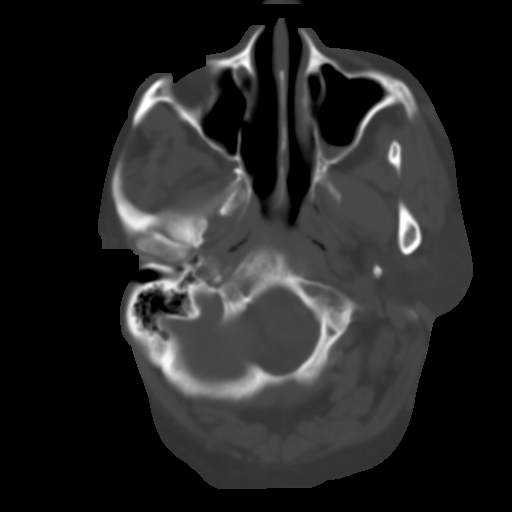
[im 7/31  brain]
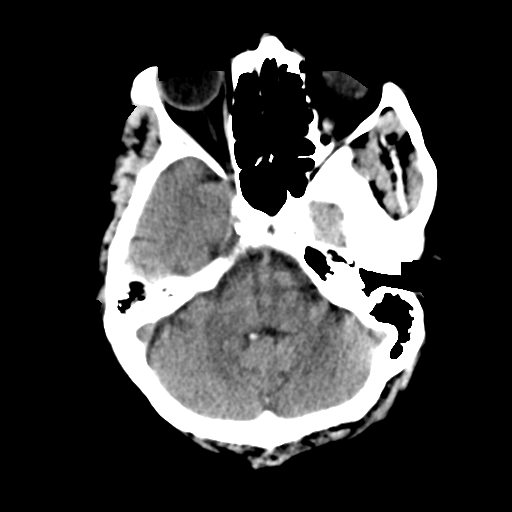
[im 9/31  brain]
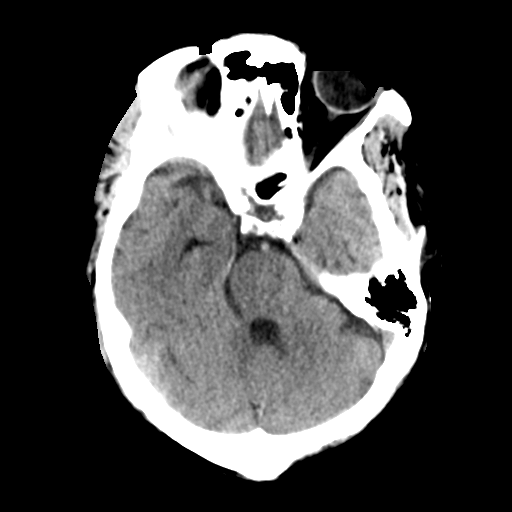
[im 13/31  brain]
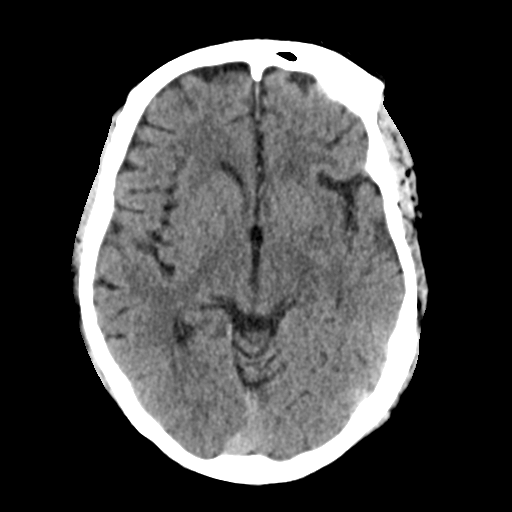
[im 16/31  brain]
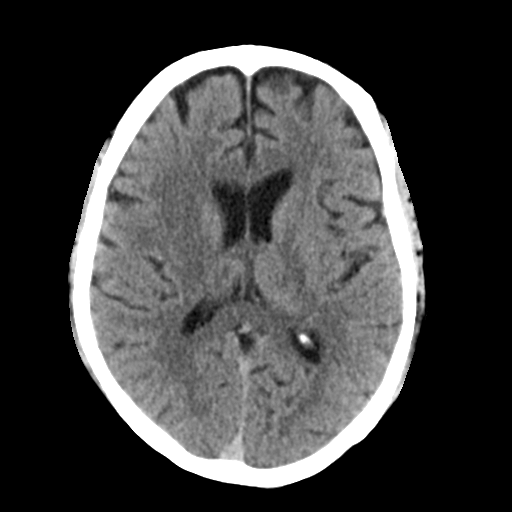
[im 16/31  bone]
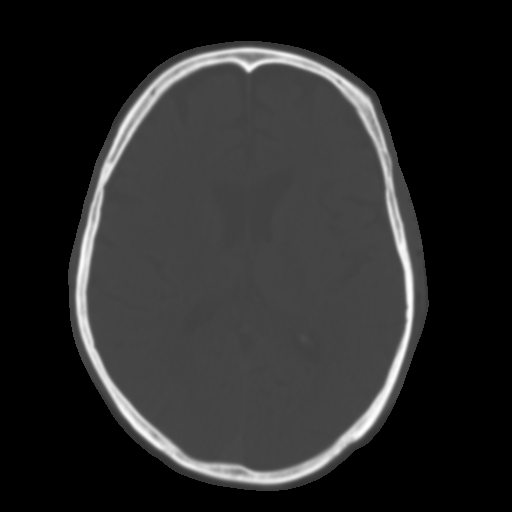
[im 18/31  brain]
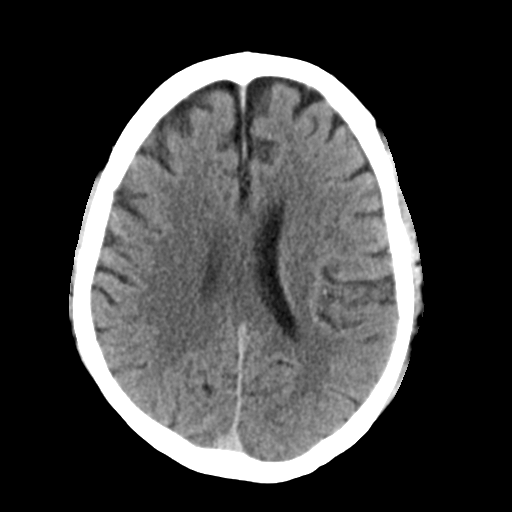
[im 22/31  brain]
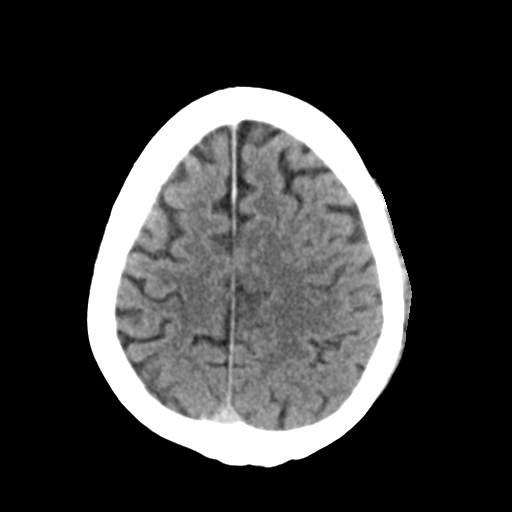
[im 24/31  brain]
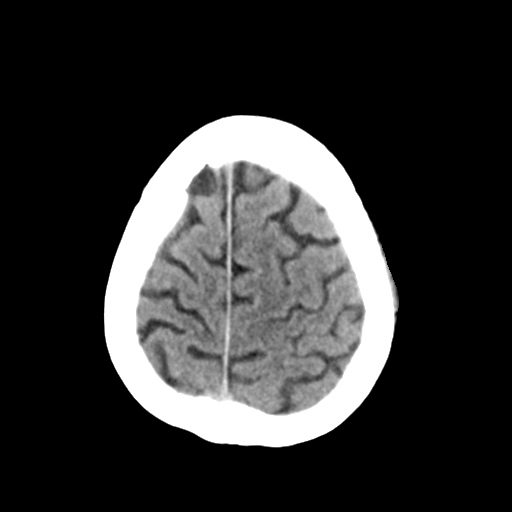
[im 28/31  brain]
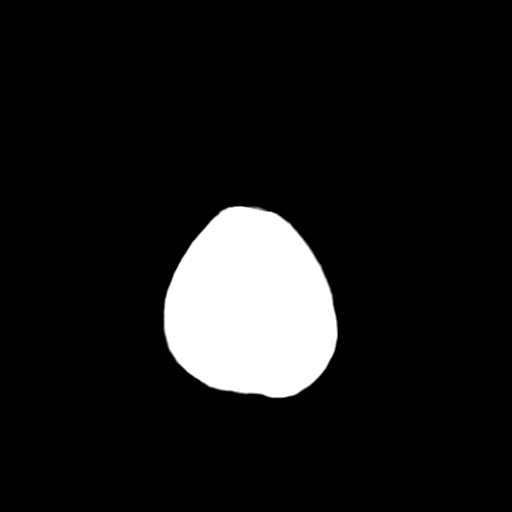
[im 28/31  bone]
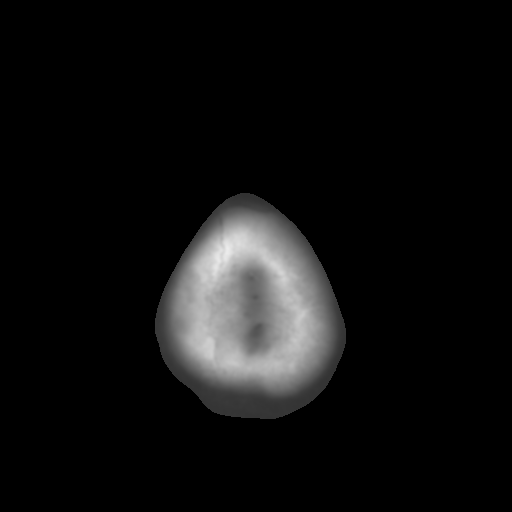

[Series 5: coronal soft tissue · coronal · 0.31mm/px · 3 of 67 slices shown]
[im 23/67  brain]
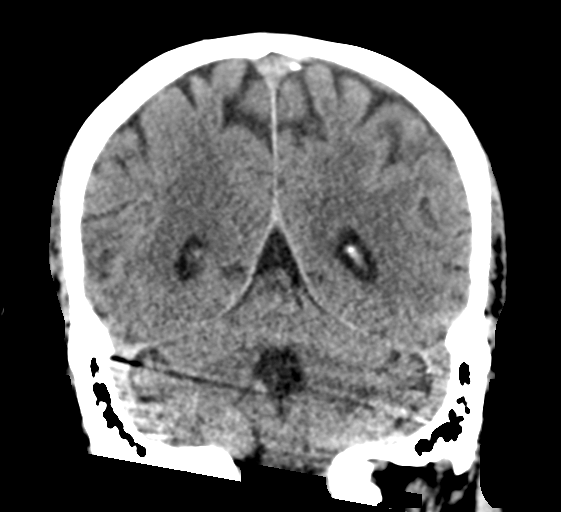
[im 30/67  brain]
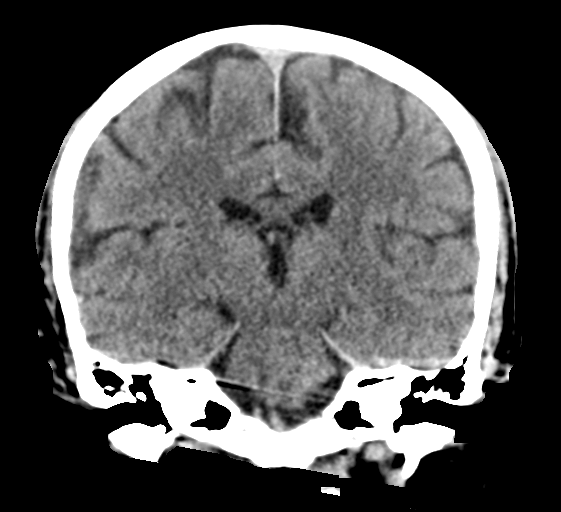
[im 37/67  brain]
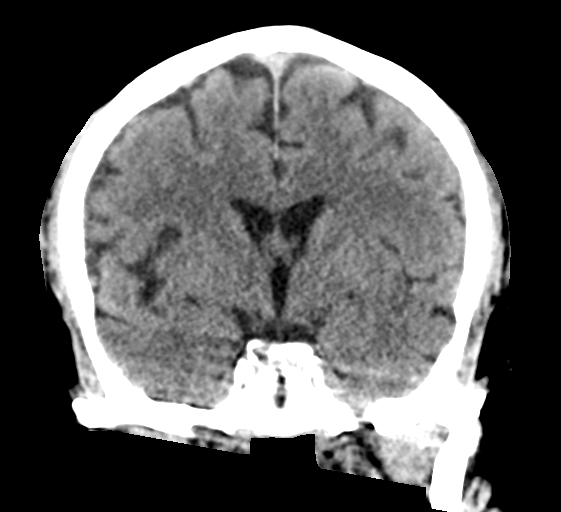

[Series 6: sagittal soft tissue · sagittal · 0.31mm/px · 3 of 56 slices shown]
[im 22/56  brain]
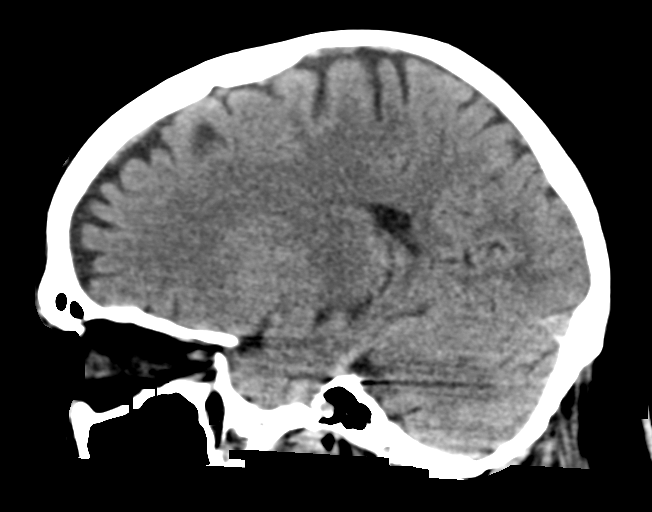
[im 28/56  brain]
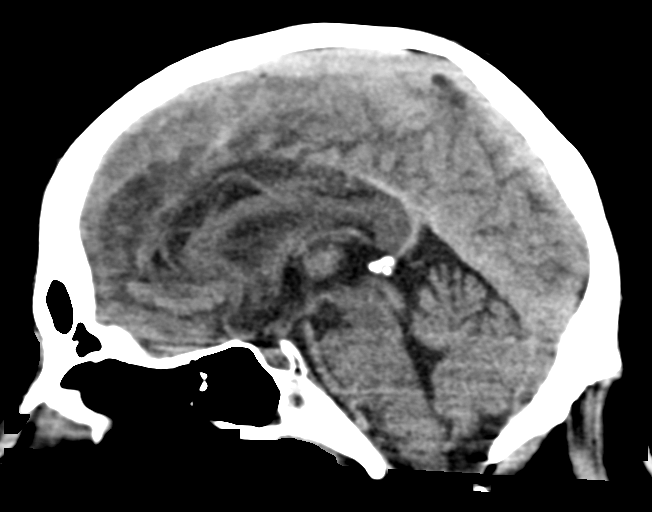
[im 34/56  brain]
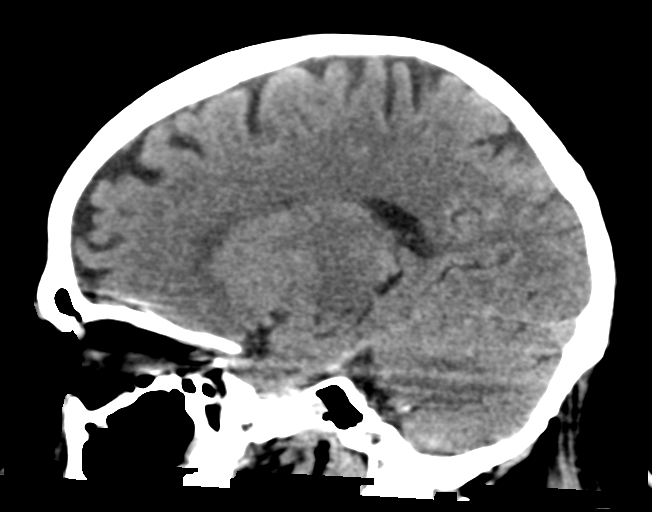

[15 of 47 positions shown; findings below may reference images not displayed]

FINDINGS: Brain: No evidence of acute infarction, hemorrhage, hydrocephalus,
extra-axial collection or mass lesion/mass effect.

Vascular: Negative for hyperdense vessel

Skull: Negative

Sinuses/Orbits: Mucosal edema left maxillary sinus. Mild mucosal
edema ethmoid sinuses bilaterally. Negative orbit.

Other: None

ASPECTS (Alberta Stroke Program Early CT Score)

- Ganglionic level infarction (caudate, lentiform nuclei, internal
capsule, insula, M1-M3 cortex): 7

- Supraganglionic infarction (M4-M6 cortex): 3

Total score (0-10 with 10 being normal): 10
IMPRESSION: 1. Negative CT head
2. ASPECTS is 10
3. These results were called by telephone at the time of
interpretation on 08/11/2020 at [DATE] to provider DALMA ERA ,
who verbally acknowledged these results.

## 2022-08-20 IMAGING — MR MR MRA HEAD W/O CM
1 series · 17 of 48 positions shown · non-contrast
Comparison: Head CT 9497 hours today.

CLINICAL DATA: 59-year-old male with renal failure, dark urine.
"Sick for 2 weeks". Encephalopathic.

EXAM:
MRI HEAD WITHOUT CONTRAST
MRA HEAD WITHOUT CONTRAST
TECHNIQUE: Multiplanar, multiecho pulse sequences of the brain and surrounding
structures were obtained without intravenous contrast. Angiographic
images of the head were obtained using MRA technique without
contrast.

[Series 11: TOF · axial · 0.5mm · 0.41mm/px · z∈[-117,-21]mm · 17 of 205 slices shown]
[im 1/205]
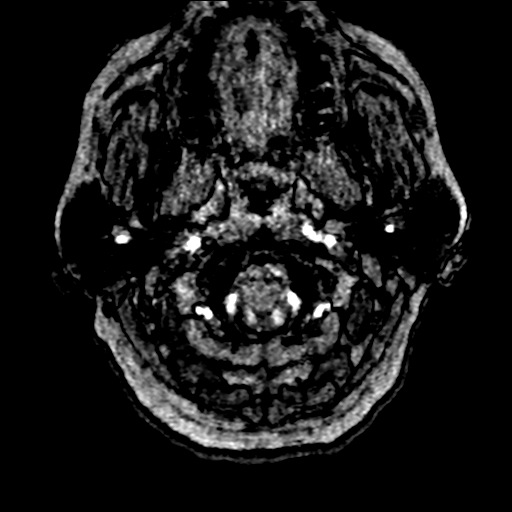
[im 5/205]
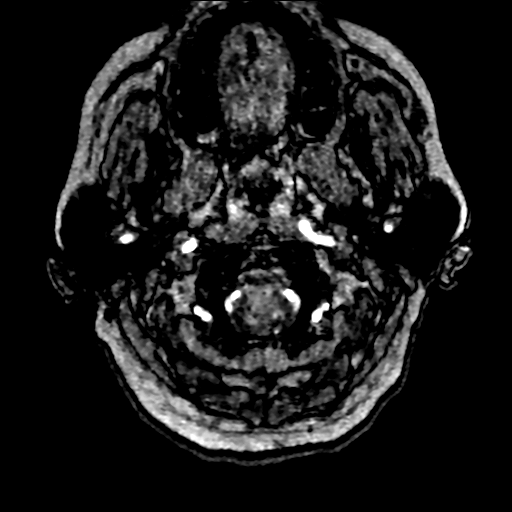
[im 9/205]
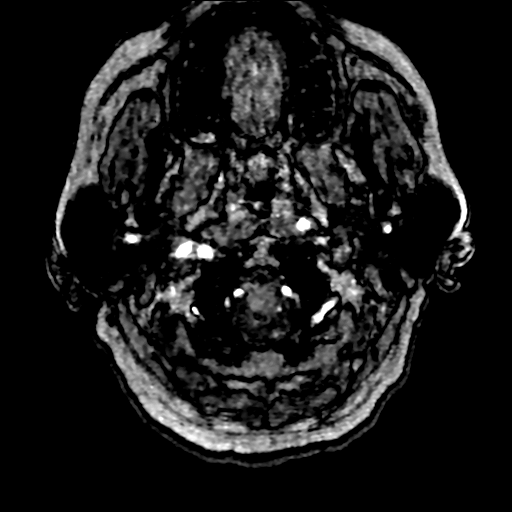
[im 14/205]
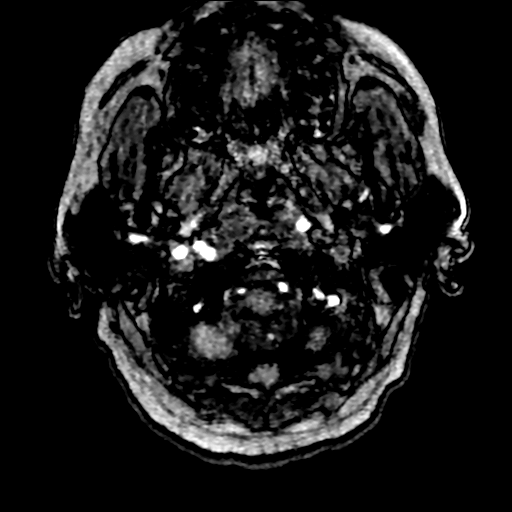
[im 18/205]
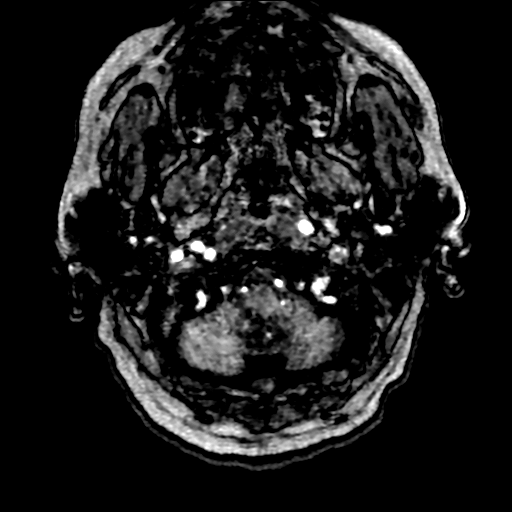
[im 22/205]
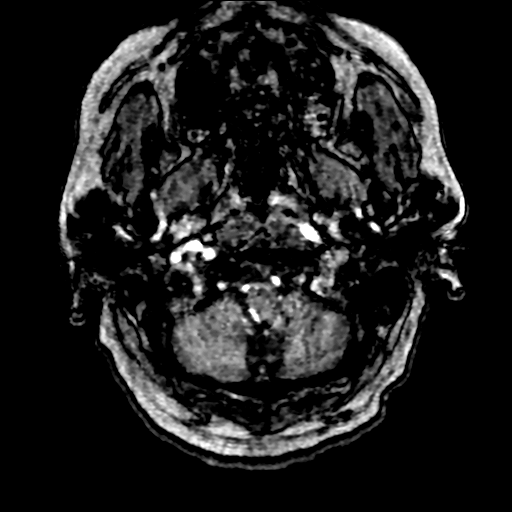
[im 27/205]
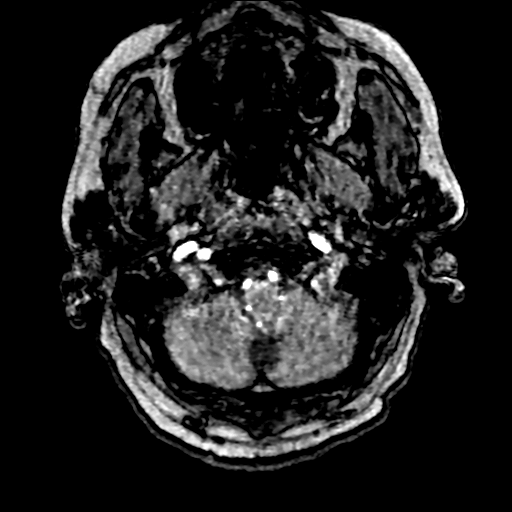
[im 35/205]
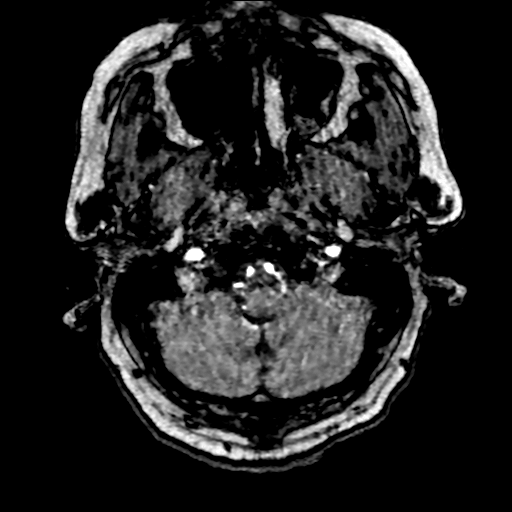
[im 40/205]
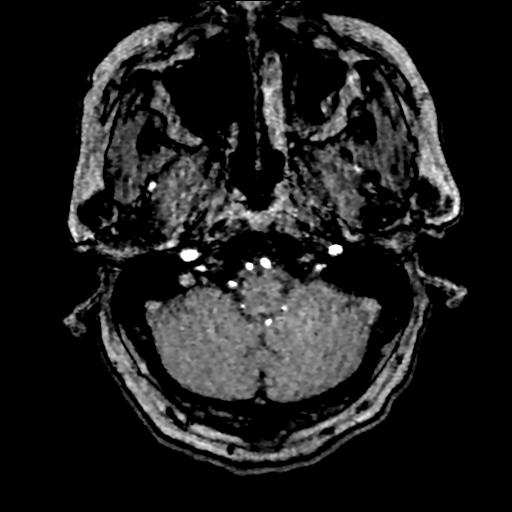
[im 66/205]
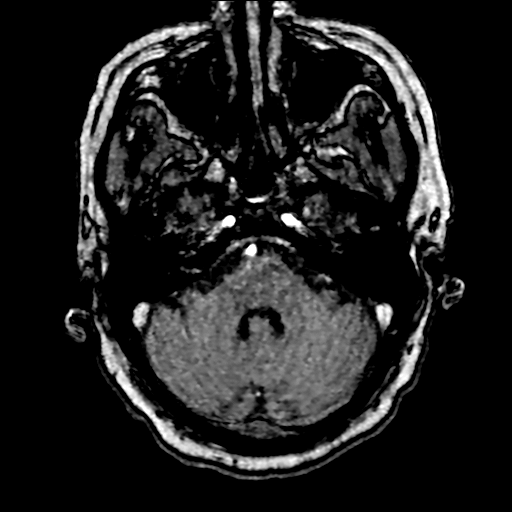
[im 92/205]
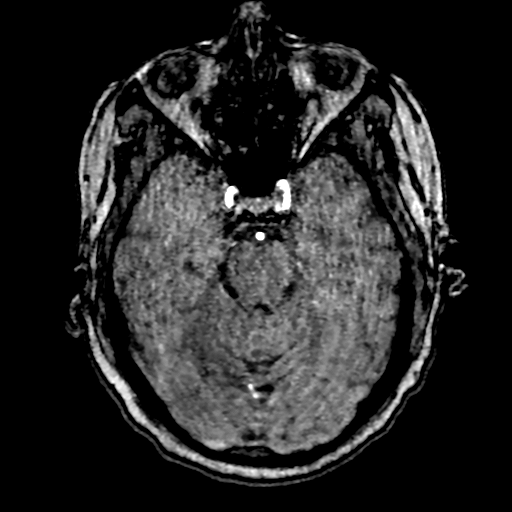
[im 105/205]
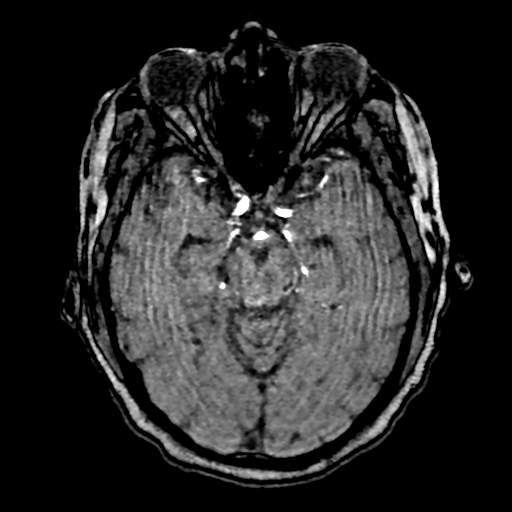
[im 118/205]
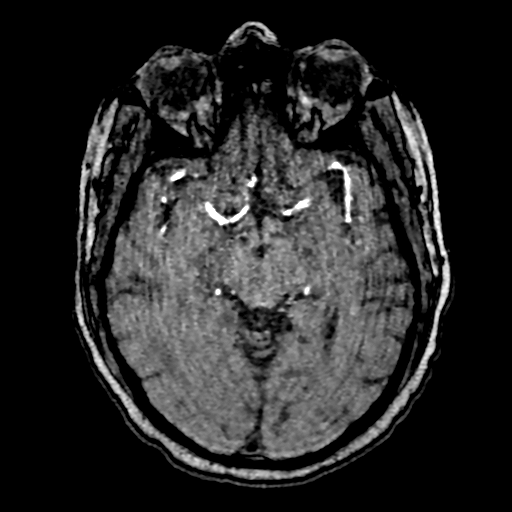
[im 144/205]
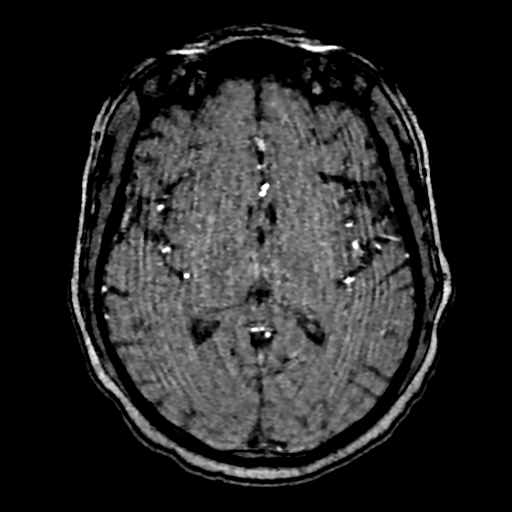
[im 170/205]
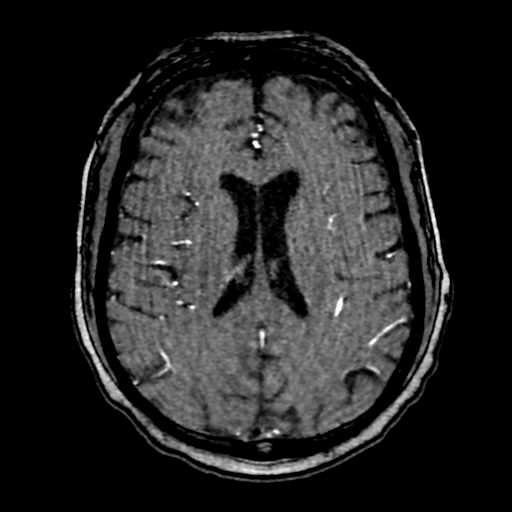
[im 174/205]
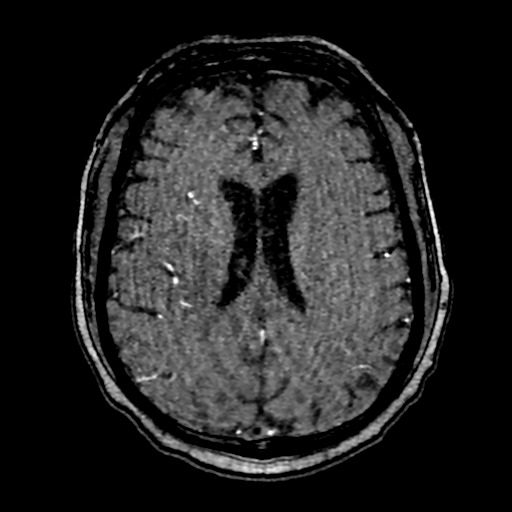
[im 196/205]
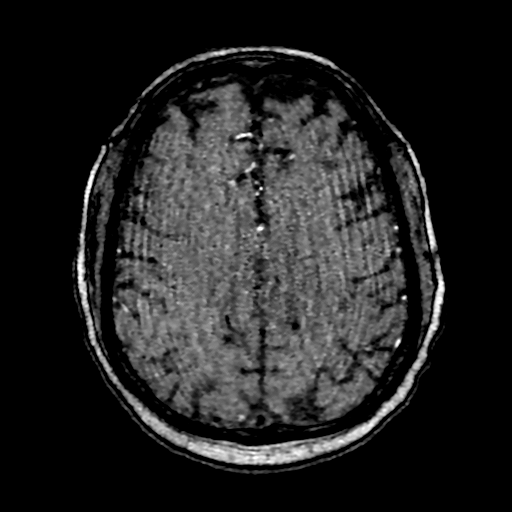

[17 of 48 positions shown; findings below may reference images not displayed]

FINDINGS: Study is intermittently degraded by motion artifact despite repeated
imaging attempts.

MRI HEAD FINDINGS

Brain: Linear 7 mm infarct in the medial left cerebellum (series 4,
image 31).

Superimposed subtle abnormal diffusion in the cortex of the right
occipital pole (correlated with abnormal T2 and FLAIR hyperintensity
as seen on series 20, image 4), and fairly symmetrically involving
the superior perirolandic cortex bilaterally (series 2, images 38
and 39 and note abnormal corresponding FLAIR series 18, image 22).
The affected cortex is not expanded. There is no associated
hemorrhage or mass effect. White matter seems to be spared in each
of the affected areas.

Questionable additional abnormal DWI in the right operculum (series
2, image 27), and along the anterior margin of the inferior right
basal ganglia (image 22).

Otherwise the deep gray nuclei and brainstem appear normal. No acute
or chronic blood products identified. No debris within the
ventricles. No midline shift, evidence of mass lesion,
ventriculomegaly, extra-axial collection. Cervicomedullary junction
and pituitary are within normal limits. No chronic cortical
encephalomalacia identified.

Vascular: Major intracranial vascular flow voids are preserved.

Skull and upper cervical spine: Negative.

Sinuses/Orbits: Negative orbits. Scattered paranasal sinus fluid and
mild mucosal thickening.

Other: Mastoids are clear. Grossly normal visible internal auditory
structures. Small volume retained secretions in the nasopharynx.

MRA HEAD FINDINGS

Mild motion artifact.

Preserved antegrade flow in the posterior circulation with fairly
codominant distal vertebral arteries. Both PICA origins are patent.
Patent vertebrobasilar junction and basilar artery without stenosis.
Patent SCA and PCA origins. Posterior communicating arteries are
diminutive or absent. Bilateral PCA branches are within normal
limits.

Antegrade flow in both ICA siphons. Tortuous ICAs just below the
skull base. No siphon stenosis. Patent carotid termini. Patent MCA
and ACA origins. Diminutive anterior communicating artery. Visible
bilateral ACA branches are within normal limits. Bilateral MCA M1
segments and MCA bifurcations are patent without stenosis. Visible
bilateral MCA branches are within normal limits.
IMPRESSION: 1. Quasi-symmetric abnormal cortical DWI, T2, and FLAIR signal in
the superior perirolandic cortex and occipital lobes (definitely the
right). No gyral thickening. No hemorrhage or mass effect.
Sparing of deep gray matter nuclei, white matter, and the brainstem.
There is a superimposed small acute infarct in the medial left
cerebellum.

2. Top differential considerations include small acute cortical
infarcts, infectious encephalitis, and perhaps mild posterior
reversible encephalopathy syndrome (PRES).
Seizure disorder is not suspected clinically, and the pattern is NOT
suggestive of uremic encephalopathy.

3. Essentially negative intracranial MRA.

Preliminary report of the above was discussed by telephone with Dr.
ZIA URAHMAN TUTI on 08/11/2020 at 5522 hours.
